# Patient Record
Sex: Female | Born: 1950
Health system: Southern US, Community
[De-identification: ages and names within clinical notes are randomized; demographics above are authoritative.]

## PROBLEM LIST (undated history)

## (undated) DIAGNOSIS — R112 Nausea with vomiting, unspecified: Secondary | ICD-10-CM

## (undated) DIAGNOSIS — M810 Age-related osteoporosis without current pathological fracture: Secondary | ICD-10-CM

## (undated) DIAGNOSIS — Z9889 Other specified postprocedural states: Secondary | ICD-10-CM

## (undated) HISTORY — DX: Age-related osteoporosis without current pathological fracture: M81.0

## (undated) HISTORY — PX: ABDOMINAL HYSTERECTOMY: SHX81

## (undated) HISTORY — PX: SPLENECTOMY: SUR1306

## (undated) HISTORY — PX: CHOLECYSTECTOMY: SHX55

---

## 2018-12-14 ENCOUNTER — Other Ambulatory Visit: Payer: Self-pay

## 2018-12-14 DIAGNOSIS — Z20822 Contact with and (suspected) exposure to covid-19: Secondary | ICD-10-CM

## 2018-12-15 LAB — NOVEL CORONAVIRUS, NAA: SARS-CoV-2, NAA: NOT DETECTED

## 2019-08-30 DIAGNOSIS — Z0189 Encounter for other specified special examinations: Secondary | ICD-10-CM | POA: Diagnosis not present

## 2019-08-30 DIAGNOSIS — D580 Hereditary spherocytosis: Secondary | ICD-10-CM | POA: Diagnosis not present

## 2019-08-30 DIAGNOSIS — Z9081 Acquired absence of spleen: Secondary | ICD-10-CM | POA: Diagnosis not present

## 2019-08-30 DIAGNOSIS — Z9049 Acquired absence of other specified parts of digestive tract: Secondary | ICD-10-CM | POA: Diagnosis not present

## 2019-09-20 DIAGNOSIS — R7301 Impaired fasting glucose: Secondary | ICD-10-CM | POA: Diagnosis not present

## 2019-09-20 DIAGNOSIS — E559 Vitamin D deficiency, unspecified: Secondary | ICD-10-CM | POA: Diagnosis not present

## 2019-09-20 DIAGNOSIS — Z0000 Encounter for general adult medical examination without abnormal findings: Secondary | ICD-10-CM | POA: Diagnosis not present

## 2019-09-25 DIAGNOSIS — Z9081 Acquired absence of spleen: Secondary | ICD-10-CM | POA: Diagnosis not present

## 2019-09-25 DIAGNOSIS — D580 Hereditary spherocytosis: Secondary | ICD-10-CM | POA: Diagnosis not present

## 2019-09-25 DIAGNOSIS — Z9049 Acquired absence of other specified parts of digestive tract: Secondary | ICD-10-CM | POA: Diagnosis not present

## 2019-09-25 DIAGNOSIS — Z0001 Encounter for general adult medical examination with abnormal findings: Secondary | ICD-10-CM | POA: Diagnosis not present

## 2019-09-26 ENCOUNTER — Other Ambulatory Visit (HOSPITAL_COMMUNITY): Payer: Self-pay | Admitting: Internal Medicine

## 2019-09-26 DIAGNOSIS — Z1231 Encounter for screening mammogram for malignant neoplasm of breast: Secondary | ICD-10-CM

## 2019-11-13 ENCOUNTER — Other Ambulatory Visit: Payer: Self-pay

## 2019-11-13 ENCOUNTER — Encounter (HOSPITAL_COMMUNITY): Payer: Self-pay

## 2019-11-13 ENCOUNTER — Ambulatory Visit (HOSPITAL_COMMUNITY)
Admission: RE | Admit: 2019-11-13 | Discharge: 2019-11-13 | Disposition: A | Discharge status: Home / Self Care | Payer: Medicare Other | Source: Ambulatory Visit | Attending: Internal Medicine | Admitting: Internal Medicine

## 2019-11-13 DIAGNOSIS — Z1231 Encounter for screening mammogram for malignant neoplasm of breast: Secondary | ICD-10-CM | POA: Insufficient documentation

## 2020-03-11 DIAGNOSIS — Z1211 Encounter for screening for malignant neoplasm of colon: Secondary | ICD-10-CM | POA: Diagnosis not present

## 2020-04-10 DIAGNOSIS — Z9081 Acquired absence of spleen: Secondary | ICD-10-CM | POA: Diagnosis not present

## 2020-04-10 DIAGNOSIS — Z9049 Acquired absence of other specified parts of digestive tract: Secondary | ICD-10-CM | POA: Diagnosis not present

## 2020-04-10 DIAGNOSIS — D580 Hereditary spherocytosis: Secondary | ICD-10-CM | POA: Diagnosis not present

## 2020-04-10 DIAGNOSIS — Z0001 Encounter for general adult medical examination with abnormal findings: Secondary | ICD-10-CM | POA: Diagnosis not present

## 2020-04-14 DIAGNOSIS — Z9081 Acquired absence of spleen: Secondary | ICD-10-CM | POA: Diagnosis not present

## 2020-04-14 DIAGNOSIS — Z9049 Acquired absence of other specified parts of digestive tract: Secondary | ICD-10-CM | POA: Diagnosis not present

## 2020-04-14 DIAGNOSIS — Z23 Encounter for immunization: Secondary | ICD-10-CM | POA: Diagnosis not present

## 2020-04-14 DIAGNOSIS — D580 Hereditary spherocytosis: Secondary | ICD-10-CM | POA: Diagnosis not present

## 2020-09-28 DIAGNOSIS — Z0100 Encounter for examination of eyes and vision without abnormal findings: Secondary | ICD-10-CM | POA: Diagnosis not present

## 2020-09-28 DIAGNOSIS — H524 Presbyopia: Secondary | ICD-10-CM | POA: Diagnosis not present

## 2020-11-24 ENCOUNTER — Other Ambulatory Visit (HOSPITAL_COMMUNITY): Payer: Self-pay | Admitting: Internal Medicine

## 2020-11-24 DIAGNOSIS — Z1231 Encounter for screening mammogram for malignant neoplasm of breast: Secondary | ICD-10-CM

## 2020-11-25 ENCOUNTER — Other Ambulatory Visit: Payer: Self-pay

## 2020-11-25 ENCOUNTER — Ambulatory Visit (HOSPITAL_COMMUNITY)
Admission: RE | Admit: 2020-11-25 | Discharge: 2020-11-25 | Disposition: A | Discharge status: Home / Self Care | Payer: Medicare Other | Source: Ambulatory Visit | Attending: Internal Medicine | Admitting: Internal Medicine

## 2020-11-25 DIAGNOSIS — Z1231 Encounter for screening mammogram for malignant neoplasm of breast: Secondary | ICD-10-CM | POA: Insufficient documentation

## 2021-01-29 DIAGNOSIS — E782 Mixed hyperlipidemia: Secondary | ICD-10-CM | POA: Diagnosis not present

## 2021-02-02 ENCOUNTER — Other Ambulatory Visit (HOSPITAL_COMMUNITY): Payer: Self-pay | Admitting: Family Medicine

## 2021-02-02 DIAGNOSIS — R040 Epistaxis: Secondary | ICD-10-CM | POA: Diagnosis not present

## 2021-02-02 DIAGNOSIS — E782 Mixed hyperlipidemia: Secondary | ICD-10-CM | POA: Diagnosis not present

## 2021-02-02 DIAGNOSIS — Z1211 Encounter for screening for malignant neoplasm of colon: Secondary | ICD-10-CM | POA: Diagnosis not present

## 2021-02-02 DIAGNOSIS — M810 Age-related osteoporosis without current pathological fracture: Secondary | ICD-10-CM

## 2021-02-08 ENCOUNTER — Other Ambulatory Visit: Payer: Self-pay

## 2021-02-08 ENCOUNTER — Ambulatory Visit (HOSPITAL_COMMUNITY)
Admission: RE | Admit: 2021-02-08 | Discharge: 2021-02-08 | Disposition: A | Discharge status: Home / Self Care | Payer: Medicare Other | Source: Ambulatory Visit | Attending: Family Medicine | Admitting: Family Medicine

## 2021-02-08 DIAGNOSIS — M810 Age-related osteoporosis without current pathological fracture: Secondary | ICD-10-CM | POA: Diagnosis not present

## 2021-02-08 DIAGNOSIS — Z780 Asymptomatic menopausal state: Secondary | ICD-10-CM | POA: Diagnosis not present

## 2021-02-11 ENCOUNTER — Encounter: Payer: Self-pay | Admitting: *Deleted

## 2021-03-09 ENCOUNTER — Other Ambulatory Visit: Payer: Self-pay

## 2021-03-09 ENCOUNTER — Ambulatory Visit (INDEPENDENT_AMBULATORY_CARE_PROVIDER_SITE_OTHER): Payer: Self-pay | Admitting: *Deleted

## 2021-03-09 ENCOUNTER — Encounter: Payer: Self-pay | Admitting: *Deleted

## 2021-03-09 VITALS — Ht 60.0 in | Wt 133.0 lb

## 2021-03-09 DIAGNOSIS — Z1211 Encounter for screening for malignant neoplasm of colon: Secondary | ICD-10-CM

## 2021-03-09 MED ORDER — ONDANSETRON HCL 4 MG PO TABS: 4.0000 mg | ORAL_TABLET | Freq: Three times a day (TID) | ORAL | 0 refills | Status: DC | PRN

## 2021-03-09 MED ORDER — PEG 3350-KCL-NA BICARB-NACL 420 G PO SOLR: 4000.0000 mL | Freq: Once | ORAL | 0 refills | Status: AC

## 2021-03-09 NOTE — Progress Notes (Signed)
Pt informed that RX for Zofran was sent in. She was instructed to start taking prior to drinking the prep. Informed she may take every 8 hours.  She voiced understanding.

## 2021-03-09 NOTE — Progress Notes (Signed)
Pt informed me that she gets real bad nausea when drinking solution.  Wants to know if nausea medicine could be prescribed to help.

## 2021-03-09 NOTE — Addendum Note (Signed)
Addended by: Gelene Mink on: 03/09/2021 02:59 PM   Modules accepted: Orders

## 2021-03-09 NOTE — Progress Notes (Signed)
Gastroenterology Pre-Procedure Review  Request Date: 03/09/2021 Requesting Physician: Susy Manor, FNP-C, Last TCS done 10 years ago in [place], no polyps per pt  PATIENT REVIEW QUESTIONS: The patient responded to the following health history questions as indicated:    1. Diabetes Melitis: no 2. Joint replacements in the past 12 months: no 3. Major health problems in the past 3 months: no 4. Has an artificial valve or MVP: no 5. Has a defibrillator: no 6. Has been advised in past to take antibiotics in advance of a procedure like teeth cleaning: no 7. Family history of colon cancer: no  8. Alcohol Use: no 9. Illicit drug Use: no 10. History of sleep apnea: no 11. History of coronary artery or other vascular stents placed within the last 12 months: no 12. History of any prior anesthesia complications: yes, nausea 13. Body mass index is 25.97 kg/m.    MEDICATIONS & ALLERGIES:    Patient reports the following regarding taking any blood thinners:   Plavix? no Aspirin? no Coumadin? no Brilinta? no Xarelto? no Eliquis? no Pradaxa? no Savaysa? no Effient? no  Patient confirms/reports the following medications:  Current Outpatient Medications  Medication Sig Dispense Refill   Cholecalciferol (VITAMIN D3) 75 MCG (3000 UT) TABS Take by mouth daily at 6 (six) AM.     Cyanocobalamin (VITAMIN B-12 PO) Take by mouth daily at 6 (six) AM. Chews 2 gummies daily.     No current facility-administered medications for this visit.    Patient confirms/reports the following allergies:  Allergies  Allergen Reactions   Penicillins Anaphylaxis   Codeine Nausea And Vomiting   Morphine And Related Other (See Comments)    Blood pressure drops   Percocet [Oxycodone-Acetaminophen] Other (See Comments)    Hallucinations    No orders of the defined types were placed in this encounter.   AUTHORIZATION INFORMATION Primary Insurance: BCBS Medicare,  ID #: [id number],  Group #:  [id number] Pre-Cert / Auth required: No, not required  SCHEDULE INFORMATION: Procedure has been scheduled as follows:  Date: 03/16/2021, Time: 12:00  Location: [facility] with Dr. Marletta Lor  This Gastroenterology Pre-Precedure Review Form is being routed to the following provider(s): Lewie Loron, NP

## 2021-03-09 NOTE — Progress Notes (Signed)
ASA 2. Appropriate. I sent in Zofran. Start taking prior to drinking the prep. May take every 8 hours.

## 2021-03-16 ENCOUNTER — Other Ambulatory Visit: Payer: Self-pay

## 2021-03-16 ENCOUNTER — Ambulatory Visit (HOSPITAL_COMMUNITY)
Admission: RE | Admit: 2021-03-16 | Discharge: 2021-03-16 | Disposition: A | Discharge status: Home / Self Care | Payer: Medicare Other | Attending: Internal Medicine | Admitting: Internal Medicine

## 2021-03-16 ENCOUNTER — Encounter (HOSPITAL_COMMUNITY)
Admission: RE | Disposition: A | Discharge status: Home / Self Care | Payer: Self-pay | Source: Home / Self Care | Attending: Internal Medicine

## 2021-03-16 ENCOUNTER — Encounter (HOSPITAL_COMMUNITY): Payer: Self-pay

## 2021-03-16 ENCOUNTER — Ambulatory Visit (HOSPITAL_COMMUNITY): Payer: Medicare Other | Admitting: Anesthesiology

## 2021-03-16 DIAGNOSIS — K648 Other hemorrhoids: Secondary | ICD-10-CM | POA: Diagnosis not present

## 2021-03-16 DIAGNOSIS — Z9081 Acquired absence of spleen: Secondary | ICD-10-CM | POA: Diagnosis not present

## 2021-03-16 DIAGNOSIS — Z1211 Encounter for screening for malignant neoplasm of colon: Secondary | ICD-10-CM | POA: Diagnosis not present

## 2021-03-16 HISTORY — DX: Nausea with vomiting, unspecified: R11.2

## 2021-03-16 HISTORY — PX: COLONOSCOPY WITH PROPOFOL: SHX5780

## 2021-03-16 HISTORY — DX: Other specified postprocedural states: Z98.890

## 2021-03-16 SURGERY — COLONOSCOPY WITH PROPOFOL
Anesthesia: General

## 2021-03-16 MED ORDER — STERILE WATER FOR IRRIGATION IR SOLN
Status: DC | PRN
  Administered 2021-03-16: .6 mL

## 2021-03-16 MED ORDER — LACTATED RINGERS IV SOLN: INTRAVENOUS | Status: DC

## 2021-03-16 MED ORDER — PROPOFOL 10 MG/ML IV BOLUS
INTRAVENOUS | Status: DC | PRN
  Administered 2021-03-16: 100 mg via INTRAVENOUS
  Administered 2021-03-16 (×3): 50 mg via INTRAVENOUS

## 2021-03-16 NOTE — H&P (Signed)
Primary Care Physician:  Benita Stabile, MD Primary Gastroenterologist:  Dr. Marletta Lor  Pre-Procedure History & Physical: HPI:  Taylor Thomas is a 70 y.o. female is here for a colonoscopy for colon cancer screening purposes.  Patient denies any family history of colorectal cancer.  No melena or hematochezia.  No abdominal pain or unintentional weight loss.  No change in bowel habits.  Overall feels well from a GI standpoint.  No past medical history on file.  No past surgical history on file.  Prior to Admission medications   Medication Sig Start Date End Date Taking? Authorizing Provider  cholecalciferol (VITAMIN D) 25 MCG (1000 UNIT) tablet Take 3,000 Units by mouth daily.   Yes [provider]  Cyanocobalamin (VITAMIN B-12 PO) Take 2 each by mouth daily. Chews 2 gummies daily.   Yes [provider]  ondansetron (ZOFRAN) 4 MG tablet Take 1 tablet (4 mg total) by mouth every 8 (eight) hours as needed for nausea or vomiting. Patient not taking: Reported on 03/11/2021 03/09/21   Gelene Mink, NP    Allergies as of 03/09/2021 - Review Complete 03/09/2021  Allergen Reaction Noted   Penicillins Anaphylaxis 03/09/2021   Codeine Nausea And Vomiting 03/09/2021   Morphine and related Other (See Comments) 03/09/2021   Percocet [oxycodone-acetaminophen] Other (See Comments) 03/09/2021    Family History  Problem Relation Age of Onset   Breast cancer Mother    Breast cancer Sister     Social History   Socioeconomic History   Marital status: Widowed    Spouse name: Not on file   Number of children: Not on file   Years of education: Not on file   Highest education level: Not on file  Occupational History   Not on file  Tobacco Use   Smoking status: Not on file   Smokeless tobacco: Not on file  Substance and Sexual Activity   Alcohol use: Not on file   Drug use: Not on file   Sexual activity: Not on file  Other Topics Concern   Not on file  Social History  Narrative   Not on file   Social Determinants of Health   Financial Resource Strain: Not on file  Food Insecurity: Not on file  Transportation Needs: Not on file  Physical Activity: Not on file  Stress: Not on file  Social Connections: Not on file  Intimate Partner Violence: Not on file    Review of Systems: See HPI, otherwise negative ROS  Physical Exam: Vital signs in last 24 hours:     General:   Alert,  Well-developed, well-nourished, pleasant and cooperative in NAD Head:  Normocephalic and atraumatic. Eyes:  Sclera clear, no icterus.   Conjunctiva pink. Ears:  Normal auditory acuity. Nose:  No deformity, discharge,  or lesions. Mouth:  No deformity or lesions, dentition normal. Neck:  Supple; no masses or thyromegaly. Lungs:  Clear throughout to auscultation.   No wheezes, crackles, or rhonchi. No acute distress. Heart:  Regular rate and rhythm; no murmurs, clicks, rubs,  or gallops. Abdomen:  Soft, nontender and nondistended. No masses, hepatosplenomegaly or hernias noted. Normal bowel sounds, without guarding, and without rebound.   Msk:  Symmetrical without gross deformities. Normal posture. Extremities:  Without clubbing or edema. Neurologic:  Alert and  oriented x4;  grossly normal neurologically. Skin:  Intact without significant lesions or rashes. Cervical Nodes:  No significant cervical adenopathy. Psych:  Alert and cooperative. Normal mood and affect.  Impression/Plan: Taylor Thomas is here  for a colonoscopy to be performed for colon cancer screening purposes.  The risks of the procedure including infection, bleed, or perforation as well as benefits, limitations, alternatives and imponderables have been reviewed with the patient. Questions have been answered. All parties agreeable.

## 2021-03-16 NOTE — Anesthesia Postprocedure Evaluation (Signed)
Anesthesia Post Note  Patient: Taylor Thomas  Procedure(s) Performed: COLONOSCOPY WITH PROPOFOL  Patient location during evaluation: Endoscopy Anesthesia Type: General Anesthetic complications: no Comments: Nursing staff discharged the patient as per protocol.   No notable events documented.   Last Vitals:  Vitals:   03/16/21 1048 03/16/21 1126  BP: (!) 154/74 (!) 91/47  Pulse: 99   Resp: 12 16  Temp: 36.8 C 36.6 C  SpO2: 99% 96%    Last Pain:  Vitals:   03/16/21 1126  TempSrc: Axillary  PainSc: 0-No pain                 Stefanee Mckell C Yicel Shannon

## 2021-03-16 NOTE — Op Note (Signed)
[facility] Patient Name: Taylor Thomas Procedure Date: 03/16/2021 11:01 AM MRN: [id number] Date of Birth: June 19, 1950 Attending MD: Elon Alas. Edgar Frisk CSN: [id number] Age: 70 Admit Type: Outpatient Procedure:                Colonoscopy Indications:              Screening for colorectal malignant neoplasm Providers:                Elon Alas. Abbey Chatters, DO, Janeece Riggers, RN, Nelma Rothman,                            Technician Referring MD:              Medicines:                See the Anesthesia note for documentation of the                            administered medications Complications:            No immediate complications. Estimated Blood Loss:     Estimated blood loss: none. Procedure:                Pre-Anesthesia Assessment:                           - The anesthesia plan was to use monitored                            anesthesia care (MAC).                           After obtaining informed consent, the colonoscope                            was passed under direct vision. Throughout the                            procedure, the patient's blood pressure, pulse, and                            oxygen saturations were monitored continuously. The                            [id number]-[id number] ([id number]) was introduced through the                            anus and advanced to the the cecum, identified by                            appendiceal orifice and ileocecal valve. The                            colonoscopy was performed without difficulty. The                            patient tolerated the procedure well. The quality  of the bowel preparation was evaluated using the                            BBPS [facility] Bowel Preparation Scale) with scores                            of: Right Colon = 3, Transverse Colon = 3 and Left                            Colon = 3 (entire mucosa seen well with no residual                            staining, small  fragments of stool or opaque                            liquid). The total BBPS score equals 9. Scope In: 11:12:24 AM Scope Out: 11:24:56 AM Scope Withdrawal Time: 0 hours 7 minutes 46 seconds  Total Procedure Duration: 0 hours 12 minutes 32 seconds  Findings:      The perianal and digital rectal examinations were normal.      Non-bleeding internal hemorrhoids were found during endoscopy.      The exam was otherwise without abnormality. Impression:               - Non-bleeding internal hemorrhoids.                           - The examination was otherwise normal.                           - No specimens collected. Moderate Sedation:      Per Anesthesia Care Recommendation:           - Patient has a contact number available for                            emergencies. The signs and symptoms of potential                            delayed complications were discussed with the                            patient. Return to normal activities tomorrow.                            Written discharge instructions were provided to the                            patient.                           - Resume previous diet.                           - Continue present medications.                           -  Repeat colonoscopy in 10 years for screening                            purposes.                           - Return to GI clinic PRN. Procedure Code(s):        --- Professional ---                           U3149, Colorectal cancer screening; colonoscopy on                            individual not meeting criteria for high risk Diagnosis Code(s):        --- Professional ---                           Z12.11, Encounter for screening for malignant                            neoplasm of colon                           K64.8, Other hemorrhoids CPT copyright 2019 American Medical Association. All rights reserved. The codes documented in this report are preliminary and upon coder review may  be  revised to meet current compliance requirements. Elon Alas. Abbey Chatters, DO [place] Abbey Chatters, DO 03/16/2021 11:26:23 AM This report has been signed electronically. Number of Addenda: 0

## 2021-03-16 NOTE — Discharge Instructions (Addendum)
  Colonoscopy Discharge Instructions  Read the instructions outlined below and refer to this sheet in the next few weeks. These discharge instructions provide you with general information on caring for yourself after you leave the hospital. Your doctor may also give you specific instructions. While your treatment has been planned according to the most current medical practices available, unavoidable complications occasionally occur.   ACTIVITY You may resume your regular activity, but move at a slower pace for the next 24 hours.  Take frequent rest periods for the next 24 hours.  Walking will help get rid of the air and reduce the bloated feeling in your belly (abdomen).  No driving for 24 hours (because of the medicine (anesthesia) used during the test).   Do not sign any important legal documents or operate any machinery for 24 hours (because of the anesthesia used during the test).  NUTRITION Drink plenty of fluids.  You may resume your normal diet as instructed by your doctor.  Begin with a light meal and progress to your normal diet. Heavy or fried foods are harder to digest and may make you feel sick to your stomach (nauseated).  Avoid alcoholic beverages for 24 hours or as instructed.  MEDICATIONS You may resume your normal medications unless your doctor tells you otherwise.  WHAT YOU CAN EXPECT TODAY Some feelings of bloating in the abdomen.  Passage of more gas than usual.  Spotting of blood in your stool or on the toilet paper.  IF YOU HAD POLYPS REMOVED DURING THE COLONOSCOPY: No aspirin products for 7 days or as instructed.  No alcohol for 7 days or as instructed.  Eat a soft diet for the next 24 hours.  FINDING OUT THE RESULTS OF YOUR TEST Not all test results are available during your visit. If your test results are not back during the visit, make an appointment with your caregiver to find out the results. Do not assume everything is normal if you have not heard from your  caregiver or the medical facility. It is important for you to follow up on all of your test results.  SEEK IMMEDIATE MEDICAL ATTENTION IF: You have more than a spotting of blood in your stool.  Your belly is swollen (abdominal distention).  You are nauseated or vomiting.  You have a temperature over 101.  You have abdominal pain or discomfort that is severe or gets worse throughout the day.   Your colonoscopy was relatively unremarkable.  I did not find any polyps or evidence of colon cancer.  I recommend repeating colonoscopy in 10 years for colon cancer screening purposes.  You do internal hemorrhoids. I would recommend increasing fiber in your diet or adding OTC Benefiber/Metamucil. Be sure to drink at least 4 to 6 glasses of water daily. Follow-up with GI as needed.   I hope you have a great rest of your week!  Hennie Duos. Marletta Lor, D.O. Gastroenterology and Hepatology [facility]

## 2021-03-16 NOTE — Transfer of Care (Signed)
Immediate Anesthesia Transfer of Care Note  Patient: Taylor Thomas  Procedure(s) Performed: COLONOSCOPY WITH PROPOFOL  Patient Location: Endoscopy Unit  Anesthesia Type:General  Level of Consciousness: drowsy  Airway & Oxygen Therapy: Patient Spontanous Breathing  Post-op Assessment: Report given to RN and Post -op Vital signs reviewed and stable  Post vital signs: Reviewed and stable  Last Vitals:  Vitals Value Taken Time  BP    Temp    Pulse    Resp    SpO2      Last Pain:  Vitals:   03/16/21 1048  TempSrc: Oral  PainSc: 0-No pain      Patients Stated Pain Goal: 10 (03/16/21 1048)  Complications: No notable events documented.

## 2021-03-16 NOTE — Anesthesia Preprocedure Evaluation (Addendum)
Anesthesia Evaluation  Patient identified by MRN, date of birth, ID band Patient awake    Reviewed: Allergy & Precautions, NPO status , Patient's Chart, lab work & pertinent test results  History of Anesthesia Complications (+) PONV and history of anesthetic complications  Airway Mallampati: II  TM Distance: >3 FB Neck ROM: Full    Dental  (+) Dental Advisory Given, Teeth Intact   Pulmonary neg pulmonary ROS,    Pulmonary exam normal breath sounds clear to auscultation       Cardiovascular Exercise Tolerance: Good Normal cardiovascular exam Rhythm:Regular Rate:Normal     Neuro/Psych negative neurological ROS  negative psych ROS   GI/Hepatic negative GI ROS, Neg liver ROS,   Endo/Other  negative endocrine ROS  Renal/GU negative Renal ROS     Musculoskeletal negative musculoskeletal ROS (+)   Abdominal   Peds  Hematology  (+) Blood dyscrasia (spherocytosis , cured after splenectomy), ,   Anesthesia Other Findings   Reproductive/Obstetrics negative OB ROS                            Anesthesia Physical Anesthesia Plan  ASA: 2  Anesthesia Plan: General   Post-op Pain Management: Minimal or no pain anticipated   Induction: Intravenous  PONV Risk Score and Plan: TIVA  Airway Management Planned: Nasal Cannula and Natural Airway  Additional Equipment:   Intra-op Plan:   Post-operative Plan:   Informed Consent: I have reviewed the patients History and Physical, chart, labs and discussed the procedure including the risks, benefits and alternatives for the proposed anesthesia with the patient or authorized representative who has indicated his/her understanding and acceptance.     Dental advisory given  Plan Discussed with: CRNA and Surgeon  Anesthesia Plan Comments:        Anesthesia Quick Evaluation

## 2021-03-17 ENCOUNTER — Encounter (HOSPITAL_COMMUNITY): Payer: Self-pay | Admitting: Internal Medicine

## 2021-04-12 DIAGNOSIS — R040 Epistaxis: Secondary | ICD-10-CM | POA: Diagnosis not present

## 2021-05-13 DIAGNOSIS — R040 Epistaxis: Secondary | ICD-10-CM | POA: Diagnosis not present

## 2021-06-08 DIAGNOSIS — R040 Epistaxis: Secondary | ICD-10-CM | POA: Diagnosis not present

## 2021-07-12 DIAGNOSIS — R040 Epistaxis: Secondary | ICD-10-CM | POA: Diagnosis not present

## 2021-08-07 ENCOUNTER — Encounter (HOSPITAL_COMMUNITY): Payer: Self-pay

## 2021-08-07 ENCOUNTER — Other Ambulatory Visit: Payer: Self-pay

## 2021-08-07 ENCOUNTER — Emergency Department (HOSPITAL_COMMUNITY): Payer: Medicare Other

## 2021-08-07 ENCOUNTER — Emergency Department (HOSPITAL_COMMUNITY)
Admission: EM | Admit: 2021-08-07 | Discharge: 2021-08-07 | Disposition: A | Discharge status: Home / Self Care | Payer: Medicare Other | Attending: Emergency Medicine | Admitting: Emergency Medicine

## 2021-08-07 DIAGNOSIS — D72829 Elevated white blood cell count, unspecified: Secondary | ICD-10-CM | POA: Insufficient documentation

## 2021-08-07 DIAGNOSIS — N3000 Acute cystitis without hematuria: Secondary | ICD-10-CM | POA: Diagnosis not present

## 2021-08-07 DIAGNOSIS — N8110 Cystocele, unspecified: Secondary | ICD-10-CM | POA: Insufficient documentation

## 2021-08-07 DIAGNOSIS — R109 Unspecified abdominal pain: Secondary | ICD-10-CM | POA: Diagnosis not present

## 2021-08-07 DIAGNOSIS — Z9049 Acquired absence of other specified parts of digestive tract: Secondary | ICD-10-CM | POA: Diagnosis not present

## 2021-08-07 DIAGNOSIS — M47814 Spondylosis without myelopathy or radiculopathy, thoracic region: Secondary | ICD-10-CM | POA: Diagnosis not present

## 2021-08-07 DIAGNOSIS — K5900 Constipation, unspecified: Secondary | ICD-10-CM | POA: Diagnosis not present

## 2021-08-07 DIAGNOSIS — R110 Nausea: Secondary | ICD-10-CM | POA: Diagnosis not present

## 2021-08-07 DIAGNOSIS — M545 Low back pain, unspecified: Secondary | ICD-10-CM | POA: Diagnosis present

## 2021-08-07 DIAGNOSIS — R350 Frequency of micturition: Secondary | ICD-10-CM | POA: Diagnosis not present

## 2021-08-07 LAB — COMPREHENSIVE METABOLIC PANEL
ALT: 17 U/L (ref 0–44)
AST: 23 U/L (ref 15–41)
Albumin: 3.9 g/dL (ref 3.5–5.0)
Alkaline Phosphatase: 104 U/L (ref 38–126)
Anion gap: 8 (ref 5–15)
BUN: 17 mg/dL (ref 8–23)
CO2: 25 mmol/L (ref 22–32)
Calcium: 9.9 mg/dL (ref 8.9–10.3)
Chloride: 107 mmol/L (ref 98–111)
Creatinine, Ser: 0.7 mg/dL (ref 0.44–1.00)
GFR, Estimated: >60 mL/min (ref >60)
Glucose, Bld: 110 mg/dL — ABNORMAL HIGH (ref 70–99)
Potassium: 3.7 mmol/L (ref 3.5–5.1)
Sodium: 140 mmol/L (ref 135–145)
Total Bilirubin: 1 mg/dL (ref 0.3–1.2)
Total Protein: 8.2 g/dL — ABNORMAL HIGH (ref 6.5–8.1)

## 2021-08-07 LAB — CBC WITH DIFFERENTIAL/PLATELET
Abs Immature Granulocytes: 0.06 10*3/uL (ref 0.00–0.07)
Basophils Absolute: 0.1 10*3/uL (ref 0.0–0.1)
Basophils Relative: 1 %
Eosinophils Absolute: 0.3 10*3/uL (ref 0.0–0.5)
Eosinophils Relative: 2 %
HCT: 43 % (ref 36.0–46.0)
Hemoglobin: 14 g/dL (ref 12.0–15.0)
Immature Granulocytes: 0 %
Lymphocytes Relative: 15 %
Lymphs Abs: 2.2 10*3/uL (ref 0.7–4.0)
MCH: 28.2 pg (ref 26.0–34.0)
MCHC: 32.6 g/dL (ref 30.0–36.0)
MCV: 86.7 fL (ref 80.0–100.0)
Monocytes Absolute: 1.1 10*3/uL — ABNORMAL HIGH (ref 0.1–1.0)
Monocytes Relative: 8 %
Neutro Abs: 10.9 10*3/uL — ABNORMAL HIGH (ref 1.7–7.7)
Neutrophils Relative %: 74 %
Platelets: 410 10*3/uL — ABNORMAL HIGH (ref 150–400)
RBC: 4.96 MIL/uL (ref 3.87–5.11)
RDW: 13.3 % (ref 11.5–15.5)
WBC: 14.7 10*3/uL — ABNORMAL HIGH (ref 4.0–10.5)
nRBC: 0 % (ref 0.0–0.2)

## 2021-08-07 LAB — URINALYSIS, ROUTINE W REFLEX MICROSCOPIC
Bacteria, UA: NONE SEEN
Bilirubin Urine: NEGATIVE
Glucose, UA: NEGATIVE mg/dL
Ketones, ur: NEGATIVE mg/dL
Nitrite: NEGATIVE
Protein, ur: 30 mg/dL — AB
Specific Gravity, Urine: 1.01 (ref 1.005–1.030)
WBC, UA: >50 WBC/hpf — ABNORMAL HIGH (ref 0–5)
pH: 5 (ref 5.0–8.0)

## 2021-08-07 LAB — LIPASE, BLOOD: Lipase: 29 U/L (ref 11–51)

## 2021-08-07 IMAGING — DX DG ABDOMEN ACUTE W/ 1V CHEST
3 series · 3 of 3 positions shown · non-contrast
Comparison: None.

CLINICAL DATA: 70-year-old female with left flank pain

EXAM:
DG ABDOMEN ACUTE WITH 1 VIEW CHEST

[abdomen erect ap]
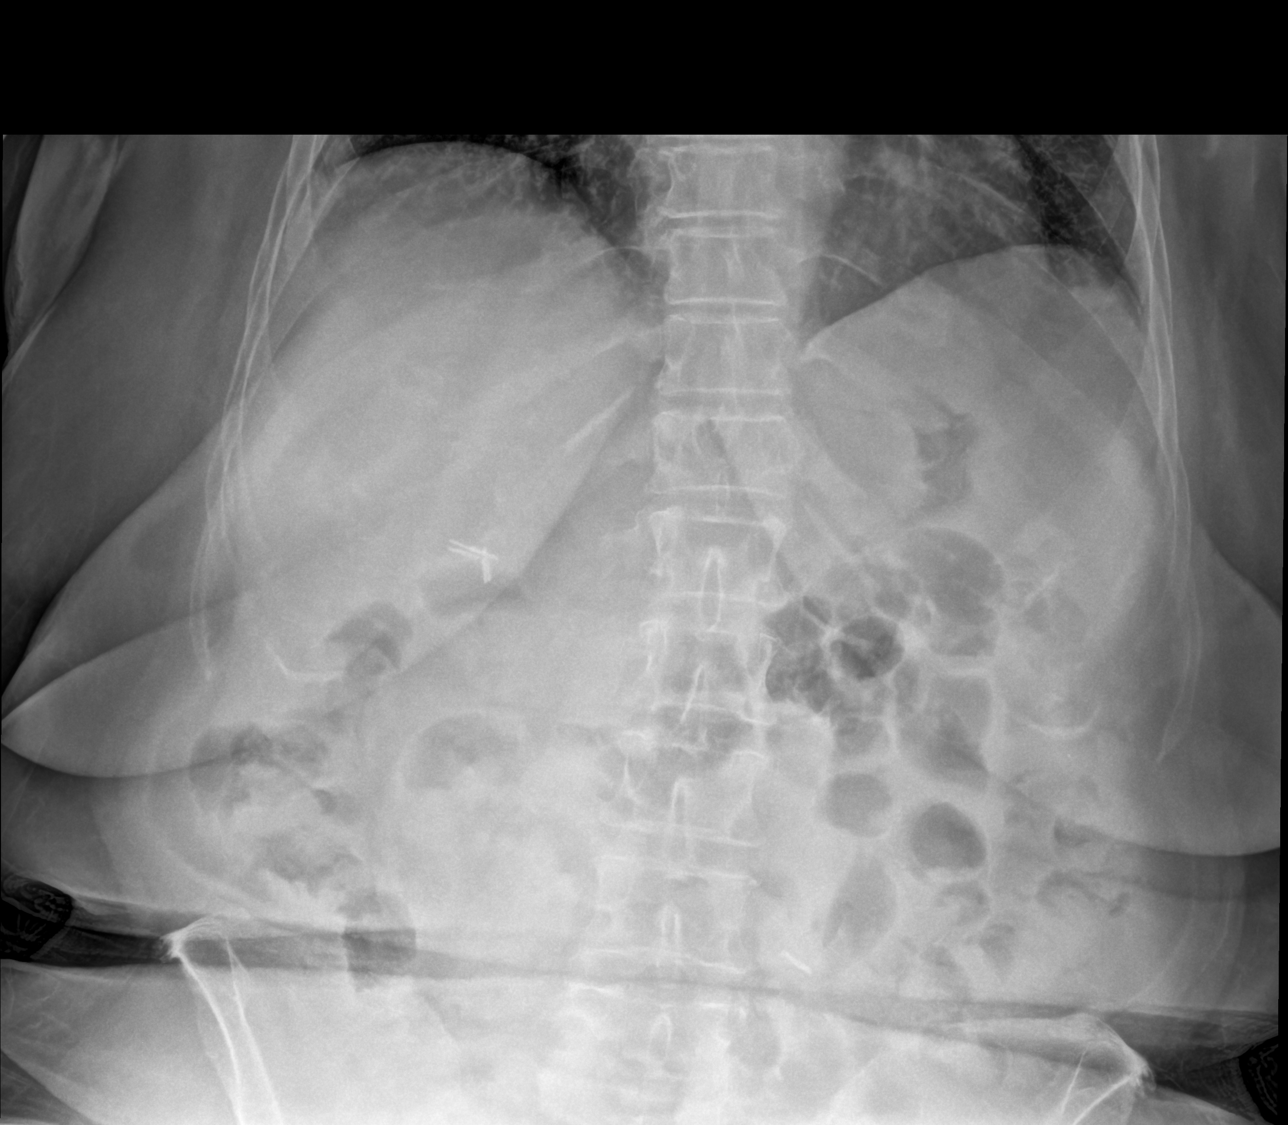

[abdomen supine]
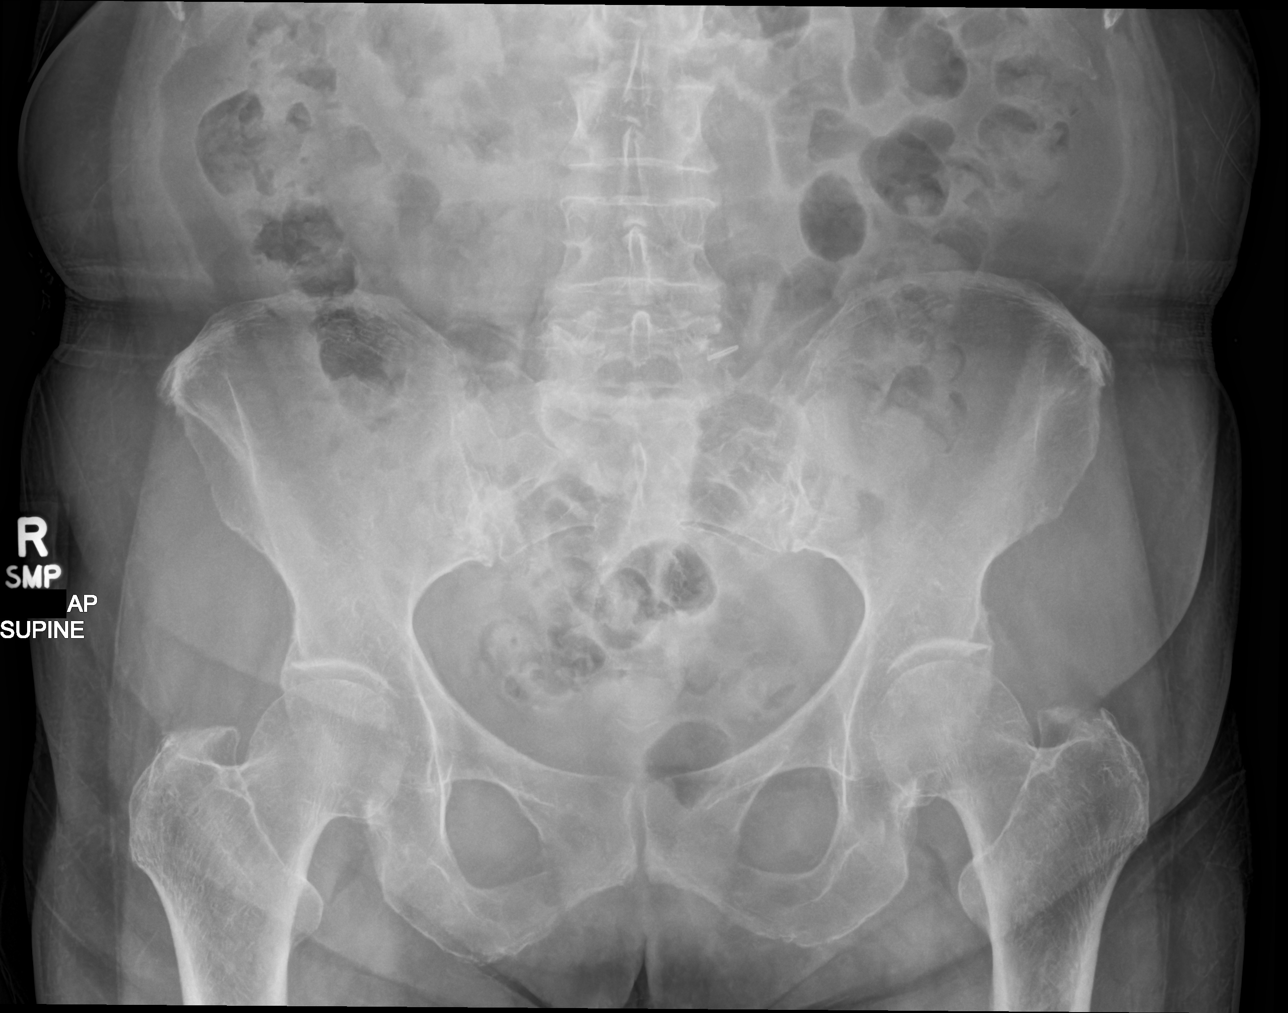

[chest ap grid]
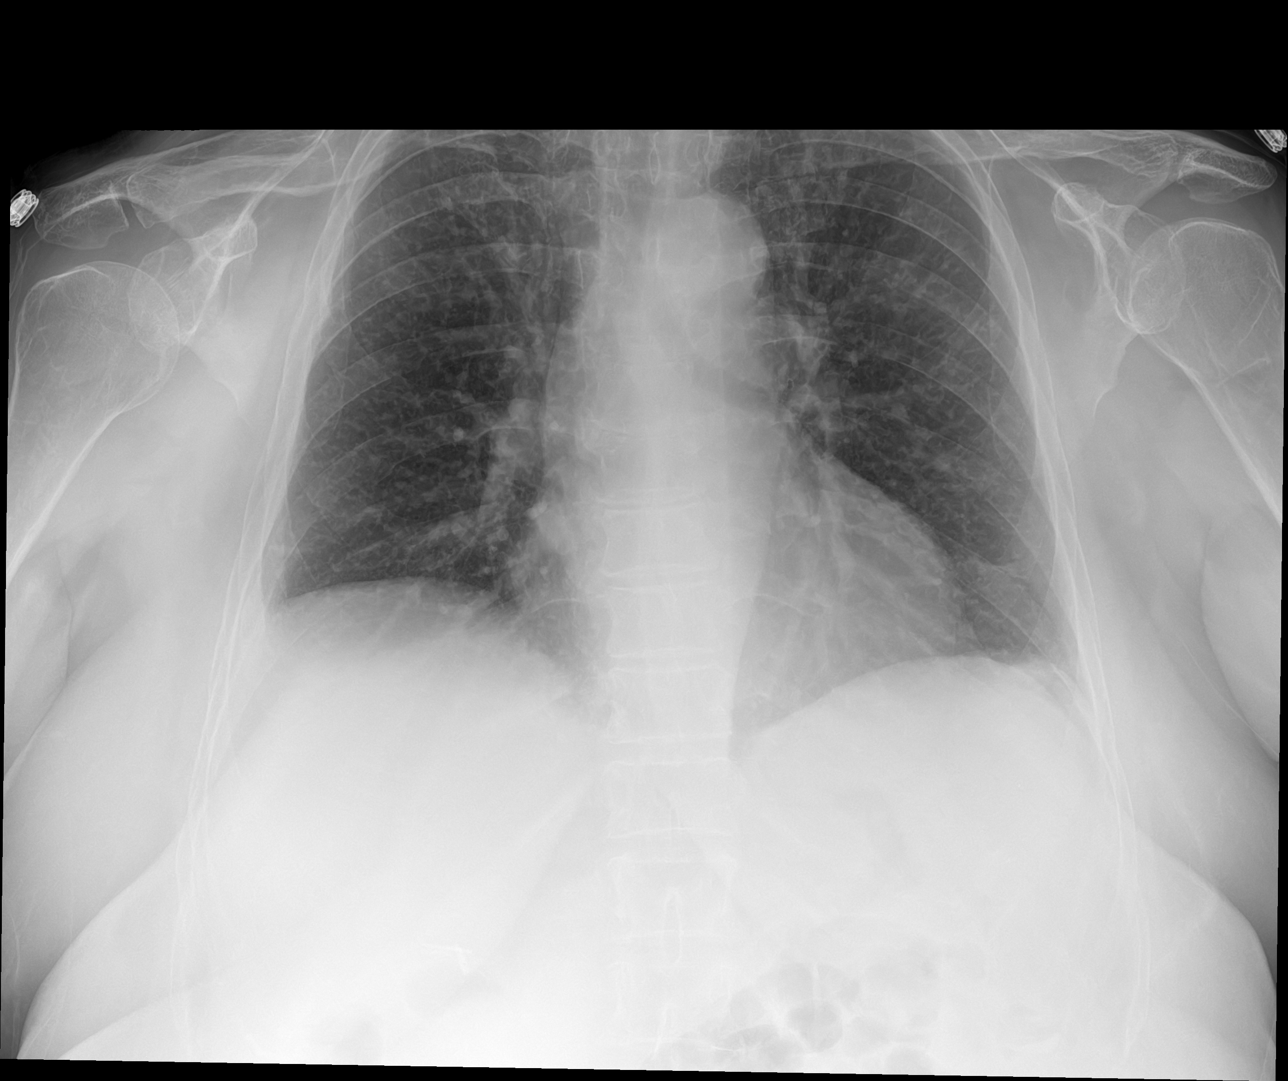

[3 of 3 positions shown; findings below may reference images not displayed]

FINDINGS: Portable upright AP view of the chest at [U1] hours. Mildly low lung
volumes. Normal cardiac size and mediastinal contours. Visualized
tracheal air column is within normal limits. No pneumothorax or
pneumoperitoneum. Allowing for portable technique the lungs are
clear. Chronic appearing right lateral rib fractures.

Portable upright AP and supine views of the abdomen and pelvis.
Cholecystectomy clips. Non obstructed bowel gas pattern. Left lower
abdominal surgical clip also. Abdominal and pelvic visceral contours
appear within normal limits. No acute osseous abnormality
identified.
IMPRESSION: 1. Normal bowel gas pattern, no free air.  Prior cholecystectomy.
2. No acute cardiopulmonary abnormality.
3. Chronic appearing right rib fractures.

## 2021-08-07 MED ORDER — LEVOFLOXACIN 750 MG PO TABS
750.0000 mg | ORAL_TABLET | Freq: Once | ORAL | Status: AC
Start: 2021-08-07 — End: 2021-08-07
  Administered 2021-08-07: 750 mg via ORAL
  Filled 2021-08-07: qty 1

## 2021-08-07 MED ORDER — SODIUM CHLORIDE 0.9 % IV BOLUS
1000.0000 mL | Freq: Once | INTRAVENOUS | Status: AC
  Administered 2021-08-07: 1000 mL via INTRAVENOUS

## 2021-08-07 MED ORDER — CIPROFLOXACIN HCL 500 MG PO TABS: 500.0000 mg | ORAL_TABLET | Freq: Two times a day (BID) | ORAL | 0 refills | Status: DC

## 2021-08-07 MED ORDER — ONDANSETRON HCL 4 MG/2ML IJ SOLN
4.0000 mg | Freq: Once | INTRAMUSCULAR | Status: AC
  Administered 2021-08-07: 4 mg via INTRAVENOUS
  Filled 2021-08-07: qty 2

## 2021-08-07 MED ORDER — METHOCARBAMOL 500 MG PO TABS: 500.0000 mg | ORAL_TABLET | Freq: Two times a day (BID) | ORAL | 0 refills | Status: DC

## 2021-08-07 MED ORDER — KETOROLAC TROMETHAMINE 30 MG/ML IJ SOLN
30.0000 mg | Freq: Once | INTRAMUSCULAR | Status: AC
  Administered 2021-08-07: 30 mg via INTRAVENOUS
  Filled 2021-08-07: qty 1

## 2021-08-07 NOTE — ED Provider Notes (Signed)
?[facility] EMERGENCY DEPARTMENT ?Provider Note ? ? ?CSN: [id number] ?Arrival date & time: 08/07/21  0545 ? ?  ? ?History ? ?Chief Complaint  ?Patient presents with  ? Flank Pain  ?  constipation  ? ? ?Taylor Thomas is a 71 y.o. female. ? ?Pt is a 71 yo female with no significant pmhx.  She presents to the ED today with low back pain, urinary frequency, and constipation.  Sx have been going on for several days.  Pt has tried a fleet's enema which worked a little, but she still feels constipated.  No burning with urination, but it feels uncomfortable.  She has noticed a bulge in her vaginal area as well.  She denies any f/c.  No n/v. ? ? ?  ? ?Home Medications ?Prior to Admission medications   ?Medication Sig Start Date End Date Taking? Authorizing Provider  ?ciprofloxacin (CIPRO) 500 MG tablet Take 1 tablet (500 mg total) by mouth 2 (two) times daily. 08/07/21  Yes Jacalyn Lefevre, MD  ?cholecalciferol (VITAMIN D) 25 MCG (1000 UNIT) tablet Take 3,000 Units by mouth daily.    [provider]  ?Cyanocobalamin (VITAMIN B-12 PO) Take 2 each by mouth daily. Chews 2 gummies daily.    [provider]  ?   ? ?Allergies    ?Penicillins, Codeine, Morphine and related, and Percocet [oxycodone-acetaminophen]   ? ?Review of Systems   ?Review of Systems  ?Gastrointestinal:  Positive for constipation.  ?Genitourinary:  Positive for frequency.  ?Musculoskeletal:  Positive for back pain.  ?All other systems reviewed and are negative. ? ?Physical Exam ?Updated Vital Signs ?BP 125/71   Pulse 82   Temp 98 ?F (36.7 ?C) (Oral)   Resp 14   Ht 5' (1.524 m)   Wt 60.3 kg   SpO2 95%   BMI 25.96 kg/m?  ?Physical Exam ?Vitals and nursing note reviewed. Exam conducted with a chaperone present.  ?Constitutional:   ?   Appearance: Normal appearance.  ?HENT:  ?   Head: Normocephalic and atraumatic.  ?   Right Ear: External ear normal.  ?   Left Ear: External ear normal.  ?   Nose: Nose normal.  ?   Mouth/Throat:  ?    Mouth: Mucous membranes are moist.  ?   Pharynx: Oropharynx is clear.  ?Eyes:  ?   Extraocular Movements: Extraocular movements intact.  ?   Conjunctiva/sclera: Conjunctivae normal.  ?   Pupils: Pupils are equal, round, and reactive to light.  ?Cardiovascular:  ?   Rate and Rhythm: Normal rate and regular rhythm.  ?   Pulses: Normal pulses.  ?   Heart sounds: Normal heart sounds.  ?Pulmonary:  ?   Effort: Pulmonary effort is normal.  ?   Breath sounds: Normal breath sounds.  ?Abdominal:  ?   General: Abdomen is flat. Bowel sounds are normal.  ?   Palpations: Abdomen is soft.  ?Genitourinary: ?   Exam position: Lithotomy position.  ?   Uterus: Absent.   ?   Comments: Cystocele noted ?Musculoskeletal:     ?   General: Normal range of motion.  ?   Cervical back: Normal range of motion and neck supple.  ?Skin: ?   General: Skin is warm.  ?   Capillary Refill: Capillary refill takes less than 2 seconds.  ?Neurological:  ?   General: No focal deficit present.  ?   Mental Status: She is alert and oriented to person, place, and time.  ?Psychiatric:     ?  Mood and Affect: Mood normal.     ?   Behavior: Behavior normal.  ? ? ?ED Results / Procedures / Treatments   ?Labs ?(all labs ordered are listed, but only abnormal results are displayed) ?Labs Reviewed  ?CBC WITH DIFFERENTIAL/PLATELET - Abnormal; Notable for the following components:  ?    Result Value  ? WBC 14.7 (*)   ? Platelets 410 (*)   ? Neutro Abs 10.9 (*)   ? Monocytes Absolute 1.1 (*)   ? All other components within normal limits  ?COMPREHENSIVE METABOLIC PANEL - Abnormal; Notable for the following components:  ? Glucose, Bld 110 (*)   ? Total Protein 8.2 (*)   ? All other components within normal limits  ?URINALYSIS, ROUTINE W REFLEX MICROSCOPIC - Abnormal; Notable for the following components:  ? Color, Urine STRAW (*)   ? APPearance HAZY (*)   ? Hgb urine dipstick MODERATE (*)   ? Protein, ur 30 (*)   ? Leukocytes,Ua MODERATE (*)   ? WBC, UA >50 (*)   ? All  other components within normal limits  ?LIPASE, BLOOD  ? ? ?EKG ?None ? ?Radiology ?DG Abdomen Acute W/Chest ? ?Result Date: 08/07/2021 ?CLINICAL DATA:  71 year old female with left flank pain EXAM: DG ABDOMEN ACUTE WITH 1 VIEW CHEST COMPARISON:  None. FINDINGS: Portable upright AP view of the chest at 0732 hours. Mildly low lung volumes. Normal cardiac size and mediastinal contours. Visualized tracheal air column is within normal limits. No pneumothorax or pneumoperitoneum. Allowing for portable technique the lungs are clear. Chronic appearing right lateral rib fractures. Portable upright AP and supine views of the abdomen and pelvis. Cholecystectomy clips. Non obstructed bowel gas pattern. Left lower abdominal surgical clip also. Abdominal and pelvic visceral contours appear within normal limits. No acute osseous abnormality identified. IMPRESSION: 1. Normal bowel gas pattern, no free air.  Prior cholecystectomy. 2. No acute cardiopulmonary abnormality. 3. Chronic appearing right rib fractures. Electronically Signed   By: Odessa FlemingH  Hall M.D.   On: 08/07/2021 07:58  ? ?CT Renal Stone Study ? ?Result Date: 08/07/2021 ?CLINICAL DATA:  Left flank pain starting Tuesday with nausea and urinary frequency. EXAM: CT ABDOMEN AND PELVIS WITHOUT CONTRAST TECHNIQUE: Multidetector CT imaging of the abdomen and pelvis was performed following the standard protocol without IV contrast. RADIATION DOSE REDUCTION: This exam was performed according to the departmental dose-optimization program which includes automated exposure control, adjustment of the mA and/or kV according to patient size and/or use of iterative reconstruction technique. COMPARISON:  None. FINDINGS: Lower chest: Atelectasis is identified in bilateral lung bases. Hepatobiliary: No focal liver abnormality is seen. Status post cholecystectomy. No biliary dilatation. Pancreas: Unremarkable. No pancreatic ductal dilatation or surrounding inflammatory changes. Spleen: Status post  prior splenectomy. Adrenals/Urinary Tract: The bilateral adrenal glands are normal. There is minimal prominence of bilateral ureters without focal discrete obstructing stone. Bilateral kidneys are normal without hydronephrosis or kidney stones. The bladder is normal. Stomach/Bowel: Stomach is within normal limits. Appendix appears normal. No evidence of bowel wall thickening, distention, or inflammatory changes. Vascular/Lymphatic: Aortic atherosclerosis. No enlarged abdominal or pelvic lymph nodes. Reproductive: Status post hysterectomy. No adnexal masses. Other: None. Musculoskeletal: Mild degenerative joint changes of the thoracic spine are noted. IMPRESSION: 1. Minimal prominence of bilateral ureters without focal discrete obstructing stone. 2. No acute abnormality identified in the abdomen or pelvis. 3. Status post cholecystectomy, splenectomy, and hysterectomy. 4. Aortic atherosclerosis. Aortic Atherosclerosis (ICD10-I70.0). Electronically Signed   By: Gabriel CarinaWei-Chen  Lin M.D.  On: 08/07/2021 08:51   ? ?Procedures ?Procedures  ? ? ?Medications Ordered in ED ?Medications  ?sodium chloride 0.9 % bolus 1,000 mL (0 mLs Intravenous Stopped 08/07/21 0837)  ?ketorolac (TORADOL) 30 MG/ML injection 30 mg (30 mg Intravenous Given 08/07/21 0723)  ?ondansetron [facility]) injection 4 mg (4 mg Intravenous Given 08/07/21 0723)  ?levofloxacin (LEVAQUIN) tablet 750 mg (750 mg Oral Given 08/07/21 0840)  ? ? ?ED Course/ Medical Decision Making/ A&P ?  ?                        ?Medical Decision Making ?Amount and/or Complexity of Data Reviewed ?Labs: ordered. ?Radiology: ordered. ? ?Risk ?Prescription drug management. ? ? ?This patient presents to the ED for concern of constipation, urinary frequency, and back pain, this involves an extensive number of treatment options, and is a complaint that carries with it a high risk of complications and morbidity.  The differential diagnosis includes sbo, constipation, uti, kidney stone ? ? ?Co  morbidities that complicate the patient evaluation ? ?none ? ? ?Additional history obtained: ? ?Additional history obtained from epic chart review ?External records from outside source obtained and reviewed including

## 2021-08-07 NOTE — ED Triage Notes (Signed)
Pt reports left sided flank pain that started Tuesday accompanied by urinary frequency and nausea. Pt also says she has had constipation for several days.  ?

## 2021-08-09 DIAGNOSIS — N3000 Acute cystitis without hematuria: Secondary | ICD-10-CM | POA: Diagnosis not present

## 2021-08-09 DIAGNOSIS — I700 Atherosclerosis of aorta: Secondary | ICD-10-CM | POA: Diagnosis not present

## 2021-08-09 DIAGNOSIS — N8110 Cystocele, unspecified: Secondary | ICD-10-CM | POA: Diagnosis not present

## 2021-08-09 DIAGNOSIS — R109 Unspecified abdominal pain: Secondary | ICD-10-CM | POA: Diagnosis not present

## 2021-08-10 DIAGNOSIS — N3000 Acute cystitis without hematuria: Secondary | ICD-10-CM | POA: Diagnosis not present

## 2021-08-18 DIAGNOSIS — E782 Mixed hyperlipidemia: Secondary | ICD-10-CM | POA: Diagnosis not present

## 2021-08-18 DIAGNOSIS — M810 Age-related osteoporosis without current pathological fracture: Secondary | ICD-10-CM | POA: Diagnosis not present

## 2021-08-23 ENCOUNTER — Ambulatory Visit: Payer: Medicare Other | Admitting: Obstetrics & Gynecology

## 2021-08-23 ENCOUNTER — Encounter: Payer: Self-pay | Admitting: Obstetrics & Gynecology

## 2021-08-23 VITALS — BP 146/84 | HR 79 | Ht 60.0 in | Wt 137.0 lb

## 2021-08-23 DIAGNOSIS — Z87440 Personal history of urinary (tract) infections: Secondary | ICD-10-CM | POA: Diagnosis not present

## 2021-08-23 DIAGNOSIS — R3915 Urgency of urination: Secondary | ICD-10-CM

## 2021-08-23 DIAGNOSIS — N816 Rectocele: Secondary | ICD-10-CM

## 2021-08-23 NOTE — Progress Notes (Signed)
? ?GYN VISIT ?Patient name: Haroldine Redler MRN [id number]  Date of birth: 24-Feb-1951 ?Chief Complaint:   ?New Patient (Initial Visit) and Bladder Prolapse ? ?History of Present Illness:   ?Lineth Thielke is a 71 y.o. PM, PH female being seen today for the following concerns: ? ?Prolapse: This all started several weeks ago.  Initially it was back pain and urinary frequency.  Went to ER (note reviewed)- diagnosed with UTI and treated.  She was advised to follow up with our office. ?Feels a bulge when she wipes.   Notes urinary frequency.  +Urgency If she does not go immediately, occasional urge incontinence ?+Nocturia- typically 1-2x per night.  No hematuria, no dysuria. Back pain resolved.   ? ?Of note, prior to UTI she didn't necessarily have the urinary frequency or nocturia. ? ?Sexually active for the past 6 mos- denies dyspareunia.  Denies vaginal itching or irritation (though did have a reaction to Vagisil- now resolved) ? ?No LMP recorded. Patient has had a hysterectomy. ? ? ?  08/23/2021  ?  9:21 AM  ?Depression screen PHQ 2/9  ?Decreased Interest 0  ?Down, Depressed, Hopeless 0  ?PHQ - 2 Score 0  ?Altered sleeping 0  ?Tired, decreased energy 0  ?Change in appetite 0  ?Feeling bad or failure about yourself  0  ?Trouble concentrating 0  ?Moving slowly or fidgety/restless 0  ?Suicidal thoughts 0  ?PHQ-9 Score 0  ? ? ?Review of Systems:   ?Pertinent items are noted in HPI ?Denies fever/chills, dizziness, headaches, visual disturbances, fatigue, shortness of breath, chest pain, abdominal pain, vomiting, or issues with bowel movements unless otherwise stated above.  ?Pertinent History Reviewed:  ?Reviewed past medical,surgical, social, obstetrical and family history.  ?Reviewed problem list, medications and allergies. ?Physical Assessment:  ? ?Vitals:  ? 08/23/21 0914  ?BP: (!) 146/84  ?Pulse: 79  ?Weight: 137 lb (62.1 kg)  ?Height: 5' (1.524 m)  ?Body mass index is 26.76 kg/m?. ? ?     Physical Examination:   ? General appearance: alert, well appearing, and in no distress ? Psych: mood appropriate, normal affect ? Skin: warm & dry  ? Cardiovascular: normal heart rate noted ? Respiratory: normal respiratory effort, no distress ? Abdomen: soft, non-tender, no rebound, no suprapubic tenderness ? Pelvic: VULVA: normal appearing vulva with no masses, tenderness or lesions, VAGINA: normal appearing vagina with normal color and discharge, no lesions, uterus surgically absent, minimal cystocele, good apical support, stage 1 rectocele noted Extremities: no edema, no calf tenderness noted ? ?Chaperone: Lawanna Kobus Neas   ? ?Straight Catheterization Procedure for PVR: ?After verbal consent was obtained from the patient for catheterization to assess bladder emptying and residual volume the urethra and surrounding tissues were prepped with betadine and an in and out catheterization was performed.  PVR was 84mL.  Urine appeared clear yellow. The patient tolerated the procedure well. ? ?Assessment & Plan:  ?1) Recent UTI ?-plan for repeat Urine culture today ?-discussed potential causes and reviewed preventive measures ?-discussed that while prolapse can be underlying etiology based on exam, do not think her prolapse was a contributing factor ? ?2) Urinary urgency ?-discussed OAB ?-reviewed conservative and medical management ?-for now monitor, if worsening symptoms and desires medication, RTC ? ?3) Rectocele ?-reviewed findings on exam ?-discussed treatment options ranging from PFPT to surgical intervention ?-f/u prn ? ?Orders Placed This Encounter  ?Procedures  ? Urine Culture  ? ? ?No follow-ups on file. ? ? ?Myna Hidalgo, DO ?Attending Obstetrician & Gynecologist, Faculty  Practice ?[facility] for [employer], [facility] ? ? ? ?

## 2021-08-25 ENCOUNTER — Telehealth: Payer: Self-pay

## 2021-08-25 DIAGNOSIS — E782 Mixed hyperlipidemia: Secondary | ICD-10-CM | POA: Diagnosis not present

## 2021-08-25 DIAGNOSIS — I700 Atherosclerosis of aorta: Secondary | ICD-10-CM | POA: Diagnosis not present

## 2021-08-25 DIAGNOSIS — N8110 Cystocele, unspecified: Secondary | ICD-10-CM | POA: Diagnosis not present

## 2021-08-25 DIAGNOSIS — M810 Age-related osteoporosis without current pathological fracture: Secondary | ICD-10-CM | POA: Diagnosis not present

## 2021-08-25 NOTE — Telephone Encounter (Signed)
Called pt to relay test results per Dr Charlotta Newton, no answer, left vm to call back ?

## 2021-08-25 NOTE — Telephone Encounter (Signed)
-----   Message from Janyth Pupa, DO sent at 08/25/2021  3:21 PM EDT ----- ?UTI now resolved ?

## 2021-08-26 ENCOUNTER — Telehealth: Payer: Self-pay

## 2021-08-26 ENCOUNTER — Other Ambulatory Visit: Payer: Self-pay | Admitting: Obstetrics & Gynecology

## 2021-08-26 DIAGNOSIS — N3001 Acute cystitis with hematuria: Secondary | ICD-10-CM

## 2021-08-26 LAB — URINE CULTURE

## 2021-08-26 MED ORDER — NITROFURANTOIN MONOHYD MACRO 100 MG PO CAPS
100.0000 mg | ORAL_CAPSULE | Freq: Two times a day (BID) | ORAL | 0 refills | Status: AC
Start: 2021-08-26 — End: 2021-09-02

## 2021-08-26 NOTE — Telephone Encounter (Signed)
Pt called, two identifiers used. Pt given msg from Dr Charlotta Newton. Pt confirmed understanding. No questions at this time.  ?

## 2021-08-26 NOTE — Telephone Encounter (Signed)
-----   Message from Jennifer Ozan, DO sent at 08/25/2021  3:21 PM EDT ----- ?UTI now resolved ?

## 2021-08-26 NOTE — Progress Notes (Signed)
Macrobid sent in

## 2022-01-11 DIAGNOSIS — M810 Age-related osteoporosis without current pathological fracture: Secondary | ICD-10-CM | POA: Diagnosis not present

## 2022-01-11 DIAGNOSIS — T148XXA Other injury of unspecified body region, initial encounter: Secondary | ICD-10-CM | POA: Diagnosis not present

## 2022-01-11 DIAGNOSIS — R350 Frequency of micturition: Secondary | ICD-10-CM | POA: Diagnosis not present

## 2022-02-02 ENCOUNTER — Other Ambulatory Visit (HOSPITAL_COMMUNITY): Payer: Self-pay | Admitting: Internal Medicine

## 2022-02-02 ENCOUNTER — Ambulatory Visit (HOSPITAL_COMMUNITY)
Admission: RE | Admit: 2022-02-02 | Discharge: 2022-02-02 | Disposition: A | Discharge status: Home / Self Care | Payer: Medicare Other | Source: Ambulatory Visit | Attending: Internal Medicine | Admitting: Internal Medicine

## 2022-02-02 DIAGNOSIS — Z1231 Encounter for screening mammogram for malignant neoplasm of breast: Secondary | ICD-10-CM | POA: Diagnosis not present

## 2022-02-07 ENCOUNTER — Other Ambulatory Visit (HOSPITAL_COMMUNITY): Payer: Self-pay | Admitting: Internal Medicine

## 2022-02-07 DIAGNOSIS — R928 Other abnormal and inconclusive findings on diagnostic imaging of breast: Secondary | ICD-10-CM

## 2022-02-08 ENCOUNTER — Ambulatory Visit (HOSPITAL_COMMUNITY)
Admission: RE | Admit: 2022-02-08 | Discharge: 2022-02-08 | Disposition: A | Discharge status: Home / Self Care | Payer: Medicare Other | Source: Ambulatory Visit | Attending: Internal Medicine | Admitting: Internal Medicine

## 2022-02-08 DIAGNOSIS — R922 Inconclusive mammogram: Secondary | ICD-10-CM | POA: Diagnosis not present

## 2022-02-08 DIAGNOSIS — R928 Other abnormal and inconclusive findings on diagnostic imaging of breast: Secondary | ICD-10-CM | POA: Insufficient documentation

## 2022-02-23 DIAGNOSIS — E782 Mixed hyperlipidemia: Secondary | ICD-10-CM | POA: Diagnosis not present

## 2022-02-28 DIAGNOSIS — E782 Mixed hyperlipidemia: Secondary | ICD-10-CM | POA: Diagnosis not present

## 2022-02-28 DIAGNOSIS — M810 Age-related osteoporosis without current pathological fracture: Secondary | ICD-10-CM | POA: Diagnosis not present

## 2022-02-28 DIAGNOSIS — N8110 Cystocele, unspecified: Secondary | ICD-10-CM | POA: Diagnosis not present

## 2022-02-28 DIAGNOSIS — I700 Atherosclerosis of aorta: Secondary | ICD-10-CM | POA: Diagnosis not present

## 2022-03-25 ENCOUNTER — Other Ambulatory Visit: Payer: Self-pay

## 2022-03-25 DIAGNOSIS — M810 Age-related osteoporosis without current pathological fracture: Secondary | ICD-10-CM | POA: Insufficient documentation

## 2022-04-07 ENCOUNTER — Other Ambulatory Visit: Payer: Self-pay

## 2022-04-12 ENCOUNTER — Encounter (HOSPITAL_COMMUNITY)
Admission: RE | Admit: 2022-04-12 | Discharge: 2022-04-12 | Disposition: A | Discharge status: Home / Self Care | Payer: Medicare Other | Source: Ambulatory Visit | Attending: Internal Medicine | Admitting: Internal Medicine

## 2022-04-12 VITALS — BP 143/73 | HR 72 | Temp 98.5°F | Resp 12

## 2022-04-12 DIAGNOSIS — M810 Age-related osteoporosis without current pathological fracture: Secondary | ICD-10-CM | POA: Insufficient documentation

## 2022-04-12 MED ORDER — DENOSUMAB 60 MG/ML ~~LOC~~ SOSY
60.0000 mg | PREFILLED_SYRINGE | Freq: Once | SUBCUTANEOUS | Status: AC
  Administered 2022-04-12: 60 mg via SUBCUTANEOUS
  Filled 2022-04-12: qty 1

## 2022-04-12 NOTE — Progress Notes (Signed)
Diagnosis: Osteoporosis  Provider:  Dwana Melena MD  Procedure: Injection  Prolia (Denosumab), Dose: 60 mg, Site: subcutaneous, Number of injections: 1  Post Care: Observation period completed  Discharge: Condition: Stable, Destination: Home . AVS provided to patient.   Performed by:  Wyvonne Lenz, RN

## 2022-04-12 NOTE — Addendum Note (Signed)
Encounter addended by: Jimmye Wisnieski E, RN on: 04/12/2022 1:50 PM  Actions taken: Therapy plan modified

## 2022-09-12 ENCOUNTER — Encounter (HOSPITAL_COMMUNITY): Payer: Self-pay | Admitting: Internal Medicine

## 2022-10-12 ENCOUNTER — Encounter (HOSPITAL_COMMUNITY)
Admission: RE | Admit: 2022-10-12 | Discharge: 2022-10-12 | Disposition: A | Discharge status: Home / Self Care | Payer: Medicare Other | Source: Ambulatory Visit | Attending: Internal Medicine | Admitting: Internal Medicine

## 2022-10-12 VITALS — BP 149/75 | HR 80 | Temp 97.9°F | Resp 20

## 2022-10-12 DIAGNOSIS — M810 Age-related osteoporosis without current pathological fracture: Secondary | ICD-10-CM | POA: Insufficient documentation

## 2022-10-12 MED ORDER — DENOSUMAB 60 MG/ML ~~LOC~~ SOSY
60.0000 mg | PREFILLED_SYRINGE | Freq: Once | SUBCUTANEOUS | Status: AC
  Administered 2022-10-12: 60 mg via SUBCUTANEOUS

## 2022-10-12 NOTE — Progress Notes (Signed)
Diagnosis: Osteoporosis  Provider:  Dwana Melena MD  Procedure: Injection  Prolia (Denosumab), Dose: 60 mg, Site: subcutaneous, Number of injections: 1  Post Care: Observation for 10 minutes per pt  Discharge: Condition: Stable, Destination: Home . AVS provided to patient.   Performed by:  Arrie Senate, RN

## 2022-11-04 DIAGNOSIS — E782 Mixed hyperlipidemia: Secondary | ICD-10-CM | POA: Diagnosis not present

## 2022-11-04 DIAGNOSIS — M810 Age-related osteoporosis without current pathological fracture: Secondary | ICD-10-CM | POA: Diagnosis not present

## 2022-11-09 ENCOUNTER — Other Ambulatory Visit: Payer: Self-pay

## 2022-11-09 ENCOUNTER — Other Ambulatory Visit (HOSPITAL_COMMUNITY): Payer: Self-pay | Admitting: Family Medicine

## 2022-11-09 ENCOUNTER — Other Ambulatory Visit (HOSPITAL_COMMUNITY): Payer: Self-pay | Admitting: Internal Medicine

## 2022-11-09 DIAGNOSIS — N8110 Cystocele, unspecified: Secondary | ICD-10-CM | POA: Diagnosis not present

## 2022-11-09 DIAGNOSIS — Z1231 Encounter for screening mammogram for malignant neoplasm of breast: Secondary | ICD-10-CM

## 2022-11-09 DIAGNOSIS — I700 Atherosclerosis of aorta: Secondary | ICD-10-CM | POA: Diagnosis not present

## 2022-11-09 DIAGNOSIS — M810 Age-related osteoporosis without current pathological fracture: Secondary | ICD-10-CM | POA: Diagnosis not present

## 2022-11-09 DIAGNOSIS — Z0000 Encounter for general adult medical examination without abnormal findings: Secondary | ICD-10-CM | POA: Diagnosis not present

## 2022-11-09 DIAGNOSIS — E782 Mixed hyperlipidemia: Secondary | ICD-10-CM | POA: Diagnosis not present

## 2022-11-10 DIAGNOSIS — I700 Atherosclerosis of aorta: Secondary | ICD-10-CM | POA: Diagnosis not present

## 2022-12-29 DIAGNOSIS — H31092 Other chorioretinal scars, left eye: Secondary | ICD-10-CM | POA: Diagnosis not present

## 2022-12-29 DIAGNOSIS — H25013 Cortical age-related cataract, bilateral: Secondary | ICD-10-CM | POA: Diagnosis not present

## 2022-12-29 DIAGNOSIS — H2513 Age-related nuclear cataract, bilateral: Secondary | ICD-10-CM | POA: Diagnosis not present

## 2022-12-29 DIAGNOSIS — H524 Presbyopia: Secondary | ICD-10-CM | POA: Diagnosis not present

## 2023-01-04 ENCOUNTER — Other Ambulatory Visit: Payer: Self-pay

## 2023-01-12 DIAGNOSIS — H25813 Combined forms of age-related cataract, bilateral: Secondary | ICD-10-CM | POA: Diagnosis not present

## 2023-01-12 DIAGNOSIS — H353132 Nonexudative age-related macular degeneration, bilateral, intermediate dry stage: Secondary | ICD-10-CM | POA: Diagnosis not present

## 2023-01-27 DIAGNOSIS — H25811 Combined forms of age-related cataract, right eye: Secondary | ICD-10-CM | POA: Diagnosis not present

## 2023-02-10 DIAGNOSIS — H25812 Combined forms of age-related cataract, left eye: Secondary | ICD-10-CM | POA: Diagnosis not present

## 2023-02-13 ENCOUNTER — Ambulatory Visit (HOSPITAL_COMMUNITY)
Admission: RE | Admit: 2023-02-13 | Discharge: 2023-02-13 | Disposition: A | Discharge status: Home / Self Care | Payer: Medicare Other | Source: Ambulatory Visit | Attending: Internal Medicine | Admitting: Internal Medicine

## 2023-02-13 ENCOUNTER — Ambulatory Visit (HOSPITAL_COMMUNITY)
Admission: RE | Admit: 2023-02-13 | Discharge: 2023-02-13 | Disposition: A | Discharge status: Home / Self Care | Payer: Medicare Other | Source: Ambulatory Visit | Attending: Family Medicine | Admitting: Family Medicine

## 2023-02-13 DIAGNOSIS — M810 Age-related osteoporosis without current pathological fracture: Secondary | ICD-10-CM | POA: Insufficient documentation

## 2023-02-13 DIAGNOSIS — Z1231 Encounter for screening mammogram for malignant neoplasm of breast: Secondary | ICD-10-CM | POA: Diagnosis not present

## 2023-03-09 ENCOUNTER — Other Ambulatory Visit: Payer: Self-pay

## 2023-03-20 DIAGNOSIS — M810 Age-related osteoporosis without current pathological fracture: Secondary | ICD-10-CM | POA: Diagnosis not present

## 2023-03-22 ENCOUNTER — Telehealth: Payer: Self-pay

## 2023-03-22 ENCOUNTER — Other Ambulatory Visit: Payer: Self-pay

## 2023-03-22 NOTE — Telephone Encounter (Signed)
Auth Submission: NO AUTH NEEDED Site of care: Site of care: [facility] Payer: BCBS Medicare Medication & CPT/J Code(s) submitted: Prolia (Denosumab) [id number] Route of submission (phone, fax, portal): phone Phone # Fax # Auth type: Buy/Bill PB Units/visits requested: 60mg , q9months Reference number: [id number] Approval from: 03/22/23 to 04/25/23

## 2023-04-12 DIAGNOSIS — K080 Exfoliation of teeth due to systemic causes: Secondary | ICD-10-CM | POA: Diagnosis not present

## 2023-04-13 ENCOUNTER — Inpatient Hospital Stay (HOSPITAL_COMMUNITY)
Admission: RE | Admit: 2023-04-13 | Discharge: 2023-04-13 | Disposition: A | Discharge status: Home / Self Care | Payer: Medicare Other | Source: Ambulatory Visit

## 2023-04-13 ENCOUNTER — Ambulatory Visit: Payer: Medicare Other

## 2023-04-13 DIAGNOSIS — K080 Exfoliation of teeth due to systemic causes: Secondary | ICD-10-CM | POA: Diagnosis not present

## 2023-04-14 ENCOUNTER — Encounter: Payer: Medicare Other | Attending: Internal Medicine | Admitting: Emergency Medicine

## 2023-04-14 VITALS — BP 161/76 | HR 78 | Temp 98.3°F | Resp 18

## 2023-04-14 DIAGNOSIS — M810 Age-related osteoporosis without current pathological fracture: Secondary | ICD-10-CM | POA: Diagnosis not present

## 2023-04-14 MED ORDER — DENOSUMAB 60 MG/ML ~~LOC~~ SOSY
60.0000 mg | PREFILLED_SYRINGE | Freq: Once | SUBCUTANEOUS | Status: AC
  Administered 2023-04-14: 60 mg via SUBCUTANEOUS

## 2023-04-14 NOTE — Progress Notes (Signed)
Diagnosis: Osteoporosis  Provider:  Dwana Melena MD  Procedure: Injection  Prolia (Denosumab), Dose: 60 mg, Site: subcutaneous, Number of injections: 1  Injection Site(s): Right lower quad. abdomne  Post Care: Patient declined observation  Discharge: Condition: Good, Destination: Home . AVS Provided  Performed by:  Arrie Senate, RN

## 2023-05-01 ENCOUNTER — Other Ambulatory Visit: Payer: Self-pay | Admitting: Internal Medicine

## 2023-05-08 DIAGNOSIS — M810 Age-related osteoporosis without current pathological fracture: Secondary | ICD-10-CM | POA: Diagnosis not present

## 2023-05-08 DIAGNOSIS — E782 Mixed hyperlipidemia: Secondary | ICD-10-CM | POA: Diagnosis not present

## 2023-05-12 DIAGNOSIS — M810 Age-related osteoporosis without current pathological fracture: Secondary | ICD-10-CM | POA: Diagnosis not present

## 2023-05-12 DIAGNOSIS — N8110 Cystocele, unspecified: Secondary | ICD-10-CM | POA: Diagnosis not present

## 2023-05-12 DIAGNOSIS — I700 Atherosclerosis of aorta: Secondary | ICD-10-CM | POA: Diagnosis not present

## 2023-05-12 DIAGNOSIS — E782 Mixed hyperlipidemia: Secondary | ICD-10-CM | POA: Diagnosis not present

## 2023-05-12 DIAGNOSIS — Z23 Encounter for immunization: Secondary | ICD-10-CM | POA: Diagnosis not present

## 2023-05-19 ENCOUNTER — Telehealth: Payer: Self-pay

## 2023-05-19 NOTE — Telephone Encounter (Signed)
Auth Submission: NO AUTH NEEDED Site of care: Site of care: [facility] Payer: bcbs medicare Medication & CPT/J Code(s) submitted: Prolia (Denosumab) [id number] Route of submission (phone, fax, portal): phone Phone # Fax # Auth type: Buy/Bill PB Units/visits requested: 60mg ,q67months Reference numberAleen Campi #][id number] Approval from: 05/19/23 to 04/24/24

## 2023-05-30 DIAGNOSIS — W5501XA Bitten by cat, initial encounter: Secondary | ICD-10-CM | POA: Diagnosis not present

## 2023-05-30 DIAGNOSIS — S61432A Puncture wound without foreign body of left hand, initial encounter: Secondary | ICD-10-CM | POA: Diagnosis not present

## 2023-05-30 DIAGNOSIS — T148XXA Other injury of unspecified body region, initial encounter: Secondary | ICD-10-CM | POA: Diagnosis not present

## 2023-05-30 DIAGNOSIS — Z23 Encounter for immunization: Secondary | ICD-10-CM | POA: Diagnosis not present

## 2023-06-28 ENCOUNTER — Encounter (INDEPENDENT_AMBULATORY_CARE_PROVIDER_SITE_OTHER): Payer: Self-pay

## 2023-10-16 ENCOUNTER — Encounter: Payer: Medicare Other | Attending: Internal Medicine | Admitting: Internal Medicine

## 2023-10-16 VITALS — BP 175/78 | HR 88 | Temp 98.0°F | Resp 16

## 2023-10-16 DIAGNOSIS — M810 Age-related osteoporosis without current pathological fracture: Secondary | ICD-10-CM

## 2023-10-16 MED ORDER — DENOSUMAB 60 MG/ML ~~LOC~~ SOSY
60.0000 mg | PREFILLED_SYRINGE | Freq: Once | SUBCUTANEOUS | Status: AC
  Administered 2023-10-16: 60 mg via SUBCUTANEOUS

## 2023-10-16 NOTE — Progress Notes (Signed)
 Diagnosis: Osteoporosis  Provider:  Dwana Melena MD  Procedure: Injection  Prolia (Denosumab), Dose: 60 mg, Site: subcutaneous, Number of injections: 1  Injection Site(s): Right arm  Post Care: Observation period completed  Discharge: Condition: Good, Destination: Home . AVS Provided  Performed by:  Cleotilde Neer, LPN

## 2023-10-18 DIAGNOSIS — K080 Exfoliation of teeth due to systemic causes: Secondary | ICD-10-CM | POA: Diagnosis not present

## 2023-11-17 DIAGNOSIS — M810 Age-related osteoporosis without current pathological fracture: Secondary | ICD-10-CM | POA: Diagnosis not present

## 2023-11-17 DIAGNOSIS — E782 Mixed hyperlipidemia: Secondary | ICD-10-CM | POA: Diagnosis not present

## 2023-11-22 DIAGNOSIS — Z0001 Encounter for general adult medical examination with abnormal findings: Secondary | ICD-10-CM | POA: Diagnosis not present

## 2023-11-22 DIAGNOSIS — M810 Age-related osteoporosis without current pathological fracture: Secondary | ICD-10-CM | POA: Diagnosis not present

## 2023-11-22 DIAGNOSIS — Z0000 Encounter for general adult medical examination without abnormal findings: Secondary | ICD-10-CM | POA: Diagnosis not present

## 2023-11-22 DIAGNOSIS — E782 Mixed hyperlipidemia: Secondary | ICD-10-CM | POA: Diagnosis not present

## 2023-11-22 DIAGNOSIS — I700 Atherosclerosis of aorta: Secondary | ICD-10-CM | POA: Diagnosis not present

## 2023-11-22 DIAGNOSIS — M25562 Pain in left knee: Secondary | ICD-10-CM | POA: Diagnosis not present

## 2024-01-10 ENCOUNTER — Other Ambulatory Visit: Payer: Self-pay

## 2024-01-10 ENCOUNTER — Other Ambulatory Visit (INDEPENDENT_AMBULATORY_CARE_PROVIDER_SITE_OTHER): Payer: Self-pay

## 2024-01-10 ENCOUNTER — Encounter: Payer: Self-pay | Admitting: Orthopedic Surgery

## 2024-01-10 ENCOUNTER — Ambulatory Visit (INDEPENDENT_AMBULATORY_CARE_PROVIDER_SITE_OTHER): Admitting: Orthopedic Surgery

## 2024-01-10 VITALS — BP 149/75 | HR 88 | Ht 60.0 in | Wt 142.0 lb

## 2024-01-10 DIAGNOSIS — M25562 Pain in left knee: Secondary | ICD-10-CM

## 2024-01-10 DIAGNOSIS — M25561 Pain in right knee: Secondary | ICD-10-CM

## 2024-01-10 NOTE — Progress Notes (Signed)
 New Patient Visit  Assessment: Taylor Thomas is a 73 y.o. female with the following: 1. Pain in both knees, unspecified chronicity  Plan: Roshan Salamon has pain in both knees.  Left is worse than right.  She states that she fell directly on her knees greater than 6 months ago.  It was slow to resolve.  She still has some occasional pains in the front of her knees.  Radiographs obtained today demonstrates healthy appearing knees.  Mild degenerative changes noted bilaterally.  Low concern for an acute injury.  Her knees are stable.  Provided reassurance.  Medicines as needed.  Consider topical treatments or brace.  If her pain worsens, or is not getting better, can consider an injection.  She states understanding.  She will follow-up as needed.  Follow-up: Return if symptoms worsen or fail to improve.  Subjective:  Chief Complaint  Patient presents with   Knee Pain    Pt fell on both knees in Feb '25, bilat pain but L > R. Has tried tylenol and ice that helps occasionally.     History of Present Illness: Taylor Thomas is a 73 y.o. female who has been referred by Carliss Batty, NP for evaluation of bilateral knee pain.  She states that she fell, greater than 6 months ago while walking her dog.  She had swelling of her knees.  She iced them immediately.  She has been taking Tylenol as needed.  It has taken a long time for her knee symptoms to improve.  As a result, she request evaluation.  She did not note instability.  She continues to have aching sensations, especially when she is driving for long periods of time.   Review of Systems: No fevers or chills No numbness or tingling No chest pain No shortness of breath No bowel or bladder dysfunction No GI distress No headaches   Medical History:  Past Medical History:  Diagnosis Date   Osteoporosis    PONV (postoperative nausea and vomiting)     Past Surgical History:  Procedure Laterality Date   ABDOMINAL  HYSTERECTOMY     CHOLECYSTECTOMY     COLONOSCOPY WITH PROPOFOL  N/A 03/16/2021   Procedure: COLONOSCOPY WITH PROPOFOL ;  Surgeon: Cindie Carlin POUR, DO;  Location: [facility];  Service: Endoscopy;  Laterality: N/A;  12:00 / ASA II   SPLENECTOMY      Family History  Problem Relation Age of Onset   Breast cancer Mother    Breast cancer Sister    Social History   Tobacco Use   Smoking status: Never   Smokeless tobacco: Never  Vaping Use   Vaping status: Never Used  Substance Use Topics   Alcohol use: Never   Drug use: Never    Allergies  Allergen Reactions   Penicillins Anaphylaxis   Codeine Nausea And Vomiting   Morphine And Codeine Other (See Comments)    Blood pressure drops   Percocet [Oxycodone-Acetaminophen] Other (See Comments)    Hallucinations  As well as Percodan     Current Meds  Medication Sig   cholecalciferol (VITAMIN D) 25 MCG (1000 UNIT) tablet Take 3,000 Units by mouth daily.   Cyanocobalamin (VITAMIN B-12 PO) Take 2 each by mouth daily. Chews 2 gummies daily.    Objective: BP (!) 149/75   Pulse 88   Ht 5' (1.524 m)   Wt 142 lb (64.4 kg)   BMI 27.73 kg/m   Physical Exam:  General: Alert and oriented. and No acute distress. Gait: Normal  gait.  Bilateral knees without swelling.  No bruising.  She has good range of motion.  Crepitus with range of motion testing on the right.  Negative Lachman bilaterally.  No increased laxity varus or valgus stress.  IMAGING: I personally ordered and reviewed the following images   X-rays of bilateral knees were obtained in clinic today.  No acute injuries noted.  Neutral overall alignment.  Well-maintained joint space.  Sunrise view of the right patella demonstrates some lateral tracking, with small osteophytes.  Small osteophytes are appreciated within the patellofemoral joint and the left knee.  Impression: Bilateral knee x-rays with mild degenerative changes   New Medications:  No orders of the  defined types were placed in this encounter.     Oneil DELENA Horde, MD  01/10/2024 10:15 AM

## 2024-01-12 ENCOUNTER — Other Ambulatory Visit (HOSPITAL_COMMUNITY): Payer: Self-pay | Admitting: Internal Medicine

## 2024-01-12 DIAGNOSIS — Z1231 Encounter for screening mammogram for malignant neoplasm of breast: Secondary | ICD-10-CM

## 2024-02-16 ENCOUNTER — Ambulatory Visit (HOSPITAL_COMMUNITY)
Admission: RE | Admit: 2024-02-16 | Discharge: 2024-02-16 | Disposition: A | Discharge status: Home / Self Care | Source: Ambulatory Visit | Attending: Internal Medicine | Admitting: Internal Medicine

## 2024-02-16 DIAGNOSIS — Z1231 Encounter for screening mammogram for malignant neoplasm of breast: Secondary | ICD-10-CM | POA: Insufficient documentation

## 2024-03-07 ENCOUNTER — Other Ambulatory Visit (HOSPITAL_COMMUNITY): Payer: Self-pay | Admitting: Internal Medicine

## 2024-03-11 ENCOUNTER — Telehealth: Payer: Self-pay

## 2024-03-11 NOTE — Telephone Encounter (Signed)
 Auth Submission: APPROVED Site of care: Site of care: [facility] Payer: BCBS Medicare Medication & CPT/J Code(s) submitted: Prolia  (Denosumab ) [id number] Diagnosis Code:  Route of submission (phone, fax, portal): portal Phone # Fax # Auth type: Buy/Bill PB Units/visits requested: 60mg , q76months x 2 doses Reference number: [id number] Approval from: 03/07/24 to 03/07/25

## 2024-04-19 ENCOUNTER — Encounter: Admitting: *Deleted

## 2024-04-19 VITALS — BP 173/76 | HR 71 | Temp 97.9°F | Resp 16

## 2024-04-19 DIAGNOSIS — M810 Age-related osteoporosis without current pathological fracture: Secondary | ICD-10-CM

## 2024-04-19 MED ORDER — DENOSUMAB 60 MG/ML ~~LOC~~ SOSY
60.0000 mg | PREFILLED_SYRINGE | Freq: Once | SUBCUTANEOUS | Status: AC
  Administered 2024-04-19: 60 mg via SUBCUTANEOUS

## 2024-04-19 NOTE — Progress Notes (Signed)
 Diagnosis: Osteoporosis  Provider:  Hall, Zack MD  Procedure: Injection  Prolia  (Denosumab ), Dose: 60 mg, Site: subcutaneous, Number of injections: 1  Injection Site(s): Right arm  Post Care: Observation period completed  Discharge: Condition: Good, Destination: Home . AVS Declined  Performed by:  Baldwin Darice Helling, RN

## 2024-04-29 ENCOUNTER — Other Ambulatory Visit (HOSPITAL_COMMUNITY): Payer: Self-pay | Admitting: Internal Medicine

## 2024-04-29 DIAGNOSIS — Z1231 Encounter for screening mammogram for malignant neoplasm of breast: Secondary | ICD-10-CM

## 2024-05-01 ENCOUNTER — Other Ambulatory Visit: Payer: Self-pay | Admitting: Medical Genetics

## 2024-05-02 ENCOUNTER — Other Ambulatory Visit (HOSPITAL_COMMUNITY)
Admission: RE | Admit: 2024-05-02 | Discharge: 2024-05-02 | Disposition: A | Discharge status: Home / Self Care | Payer: Self-pay | Source: Ambulatory Visit | Attending: Medical Genetics | Admitting: Medical Genetics

## 2024-05-10 LAB — GENECONNECT MOLECULAR SCREEN: Genetic Analysis Overall Interpretation: NEGATIVE

## 2024-10-21 ENCOUNTER — Ambulatory Visit

## 2025-02-17 ENCOUNTER — Ambulatory Visit (HOSPITAL_COMMUNITY)
